# Patient Record
Sex: Male | Born: 2000 | Race: Black or African American | Hispanic: No | Marital: Single | Smoking: Never smoker
Health system: Southern US, Community
[De-identification: ages and names within clinical notes are randomized; demographics above are authoritative.]

## PROBLEM LIST (undated history)

## (undated) DIAGNOSIS — G43909 Migraine, unspecified, not intractable, without status migrainosus: Secondary | ICD-10-CM

## (undated) DIAGNOSIS — F840 Autistic disorder: Secondary | ICD-10-CM

## (undated) HISTORY — PX: CIRCUMCISION: SUR203

## (undated) HISTORY — PX: TONSILLECTOMY: SUR1361

## (undated) HISTORY — PX: ADENOIDECTOMY: SHX5191

---
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
## 2022-02-16 ENCOUNTER — Ambulatory Visit (INDEPENDENT_AMBULATORY_CARE_PROVIDER_SITE_OTHER): Payer: Medicaid Other | Admitting: Neurology

## 2022-02-16 ENCOUNTER — Encounter (INDEPENDENT_AMBULATORY_CARE_PROVIDER_SITE_OTHER): Payer: Self-pay | Admitting: Neurology

## 2022-02-16 VITALS — BP 100/80 | HR 79 | Ht 73.23 in | Wt 274.9 lb

## 2022-02-16 DIAGNOSIS — F411 Generalized anxiety disorder: Secondary | ICD-10-CM | POA: Diagnosis not present

## 2022-02-16 DIAGNOSIS — G43009 Migraine without aura, not intractable, without status migrainosus: Secondary | ICD-10-CM | POA: Diagnosis not present

## 2022-02-16 DIAGNOSIS — F840 Autistic disorder: Secondary | ICD-10-CM | POA: Diagnosis not present

## 2022-02-16 NOTE — Progress Notes (Signed)
Patient: Tony Kent MRN: [id number] Sex: male DOB: 2001/07/11  Provider: Keturah Shavers, MD Location of Care: [facility]  Note type: Routine return visit  Referral Source: Silvano Rusk, MD History from: mother, patient, and [facility] chart Chief Complaint: only 2 migraines in the last 14 days, wants to discuss decreasing the Amitriptyline because he has been working out and not having as much headaches  History of Present Illness: Tony Kent is a 21 y.o. male is here for follow-up management of headache. He has been having chronic and frequent migraine and tension type headaches as well as autism, ADHD and some anxiety issues. He has been on amitriptyline as a preventive medication for headache and on his last visit in December, the dose of medication increased to 75 mg every night since he was having frequent headaches. He was recommended to have more hydration with adequate sleep and limited screen time and return in a few months to see how he does. Since his last visit he has been doing very well and actually over the past few months as per mother he has been having more physical activity and working out and has lost several pounds. He usually sleeps well without any difficulty and with no awakening headaches.  He has been doing fairly well in terms of anxiety issues and about a few weeks ago he discontinued his preventive medication for headache which is not clear if he was taking it off and on for a while and then possibly discontinue the medication but definitely has not been on any medication over the past 3 weeks or so. Over the past month he has had just 2 headaches needed OTC medications.  He has not had any vomiting with the headaches.  He has no other issues and doing well otherwise.  Review of Systems: Review of system as per HPI, otherwise negative.  Past Medical History:  Diagnosis Date   Autism    Migraines    Hospitalizations: No., Head Injury: No.,  Nervous System Infections: No., Immunizations up to date: Yes.     Surgical History Past Surgical History:  Procedure Laterality Date   ADENOIDECTOMY     CIRCUMCISION     TONSILLECTOMY      Family History family history includes ADD / ADHD in his father and sister; Cancer in his maternal grandfather; Migraines in his father and mother; Schizophrenia in an other family member; Seizures in his maternal grandfather and another family member.   Social History Social History   Socioeconomic History   Marital status: Single    Spouse name: Not on file   Number of children: Not on file   Years of education: Not on file   Highest education level: Not on file  Occupational History   Not on file  Tobacco Use   Smoking status: Never   Smokeless tobacco: Never  Substance and Sexual Activity   Alcohol use: No   Drug use: No   Sexual activity: Never  Other Topics Concern   Not on file  Social History Narrative   Tony Kent attend 10th grade at [employer]. He is doing well, but is having some issues with his anxiety.  He enjoys playing video games, listening to music and playing on his phone.   Lives with his parents and siblings.   Social Determinants of Health   Financial Resource Strain: Not on file  Food Insecurity: Not on file  Transportation Needs: Not on file  Physical Activity: Not on file  Stress:  Not on file  Social Connections: Not on file     Allergies  Allergen Reactions   Other     Seasonal Allergies     Molds & Smuts Hives and Rash    Physical Exam BP 100/80   Pulse 79   Ht 6' 1.23" (1.86 m)   Wt 274 lb 14.6 oz (124.7 kg)   BMI 36.04 kg/m  Gen: Awake, alert, not in distress, Non-toxic appearance. Skin: No neurocutaneous stigmata, no rash HEENT: Normocephalic, no dysmorphic features, no conjunctival injection, nares patent, mucous membranes moist, oropharynx clear. Neck: Supple, no meningismus, no lymphadenopathy,  Resp: Clear to auscultation bilaterally CV:  Regular rate, normal S1/S2, no murmurs, no rubs Abd: Bowel sounds present, abdomen soft, non-tender, non-distended.  No hepatosplenomegaly or mass. Ext: Warm and well-perfused. No deformity, no muscle wasting, ROM full.  Neurological Examination: MS- Awake, alert, interactive Cranial Nerves- Pupils equal, round and reactive to light (5 to 55mm); fix and follows with full and smooth EOM; no nystagmus; no ptosis, funduscopy with normal sharp discs, visual field full by looking at the toys on the side, face symmetric with smile.  Hearing intact to bell bilaterally, palate elevation is symmetric, and tongue protrusion is symmetric. Tone- Normal Strength-Seems to have good strength, symmetrically by observation and passive movement. Reflexes-    Biceps Triceps Brachioradialis Patellar Ankle  R 2+ 2+ 2+ 2+ 2+  L 2+ 2+ 2+ 2+ 2+   Plantar responses flexor bilaterally, no clonus noted Sensation- Withdraw at four limbs to stimuli. Coordination- Reached to the object with no dysmetria Gait: Normal walk without any coordination or balance issues.   Assessment and Plan 1. Migraine without aura and without status migrainosus, not intractable   2. Anxiety state   3. Autism spectrum disorder    This is a 21 year old male with diagnosis of autism and ADHD who has been having episodes of migraine and tension type headaches with some anxiety issues, was on fairly moderate dose of amitriptyline but patient discontinued the medication a few weeks ago and currently is not on any medication and has not had any frequent headaches.  He has no new findings on his neurological examination. Since currently is not on any preventive medication and he is not having frequent headaches, I do not think we need to restart medication or do any other treatment. He needs to continue with more hydration, adequate sleep and limited screen time  He may take occasional Tylenol or ibuprofen for moderate to severe headache If he  develops frequent headaches, more than 5 or 6 headaches each month then mother will call my office to start her back on preventive medication for headache. Otherwise he will continue follow-up with his pediatrician and I will be available for any question or concerns.  Mother understood and agreed with the plan. I spent 30 minutes with patient and his mother, more than 50% time spent for counseling of coordination of care.  No orders of the defined types were placed in this encounter.  No orders of the defined types were placed in this encounter.
